# Patient Record
Sex: Female | Born: 1985 | Race: White | Hispanic: No | Marital: Single | Smoking: Current every day smoker
Health system: Southern US, Community
[De-identification: ages and names within clinical notes are randomized; demographics above are authoritative.]

## PROBLEM LIST (undated history)

## (undated) DIAGNOSIS — N390 Urinary tract infection, site not specified: Secondary | ICD-10-CM

## (undated) DIAGNOSIS — I2699 Other pulmonary embolism without acute cor pulmonale: Secondary | ICD-10-CM

## (undated) DIAGNOSIS — I82409 Acute embolism and thrombosis of unspecified deep veins of unspecified lower extremity: Secondary | ICD-10-CM

## (undated) DIAGNOSIS — N159 Renal tubulo-interstitial disease, unspecified: Secondary | ICD-10-CM

---

## 2013-02-25 ENCOUNTER — Emergency Department (HOSPITAL_COMMUNITY)
Admission: EM | Admit: 2013-02-25 | Discharge: 2013-02-25 | Disposition: A | Discharge status: Home / Self Care | Payer: Self-pay | Attending: Emergency Medicine | Admitting: Emergency Medicine

## 2013-02-25 ENCOUNTER — Encounter (HOSPITAL_COMMUNITY): Payer: Self-pay | Admitting: Emergency Medicine

## 2013-02-25 DIAGNOSIS — F17200 Nicotine dependence, unspecified, uncomplicated: Secondary | ICD-10-CM | POA: Insufficient documentation

## 2013-02-25 DIAGNOSIS — M549 Dorsalgia, unspecified: Secondary | ICD-10-CM

## 2013-02-25 DIAGNOSIS — N390 Urinary tract infection, site not specified: Secondary | ICD-10-CM | POA: Insufficient documentation

## 2013-02-25 DIAGNOSIS — R3915 Urgency of urination: Secondary | ICD-10-CM | POA: Insufficient documentation

## 2013-02-25 DIAGNOSIS — Z3202 Encounter for pregnancy test, result negative: Secondary | ICD-10-CM | POA: Insufficient documentation

## 2013-02-25 DIAGNOSIS — Z880 Allergy status to penicillin: Secondary | ICD-10-CM | POA: Insufficient documentation

## 2013-02-25 DIAGNOSIS — R300 Dysuria: Secondary | ICD-10-CM | POA: Insufficient documentation

## 2013-02-25 DIAGNOSIS — M545 Low back pain, unspecified: Secondary | ICD-10-CM | POA: Insufficient documentation

## 2013-02-25 DIAGNOSIS — R350 Frequency of micturition: Secondary | ICD-10-CM | POA: Insufficient documentation

## 2013-02-25 LAB — URINE MICROSCOPIC-ADD ON

## 2013-02-25 LAB — URINALYSIS, ROUTINE W REFLEX MICROSCOPIC
Bilirubin Urine: NEGATIVE
Glucose, UA: NEGATIVE mg/dL
Protein, ur: NEGATIVE mg/dL

## 2013-02-25 LAB — POCT PREGNANCY, URINE: Preg Test, Ur: NEGATIVE

## 2013-02-25 MED ORDER — CIPROFLOXACIN HCL 500 MG PO TABS: 500.0000 mg | ORAL_TABLET | Freq: Two times a day (BID) | ORAL | Status: DC

## 2013-02-25 MED ORDER — OXYCODONE-ACETAMINOPHEN 5-325 MG PO TABS: 1.0000 | ORAL_TABLET | ORAL | Status: DC | PRN

## 2013-02-25 MED ORDER — HYDROMORPHONE HCL PF 1 MG/ML IJ SOLN
1.0000 mg | Freq: Once | INTRAMUSCULAR | Status: AC
  Administered 2013-02-25: 1 mg via INTRAMUSCULAR
  Filled 2013-02-25: qty 1

## 2013-02-25 NOTE — ED Notes (Signed)
PT STATES SHE HAS "ALWAYS HAD IRREGULAR PERIODS".

## 2013-02-25 NOTE — ED Provider Notes (Signed)
Medical screening examination/treatment/procedure(s) were performed by non-physician practitioner and as supervising physician I was immediately available for consultation/collaboration.    Enid Skeens, MD 02/25/13 [phone]

## 2013-02-25 NOTE — ED Provider Notes (Signed)
CSN: [id number]     Arrival date & time 02/25/13  1314 History     First MD Initiated Contact with Patient 02/25/13 1331     Chief Complaint  Patient presents with  . Back Pain   (Consider location/radiation/quality/duration/timing/severity/associated sxs/prior Treatment) The history is provided by the patient.   Patient presents to the ED for low back pain. No recent injury, trauma, or fall. Patient states pain initially started on her lateral right hip but is slowly progressed to her right low back. Pain is radiating down her right buttock into her posterior thigh, but does not descend past the knee.  Denies any numbness or paresthesias of lower extremities. No loss of bowel or bladder function.  No prior back injury.  Pt states she has urinary sx at baseline-- dysuria, frequency, urgency.  Also has irregular menstrual cycles, unknown pregnancy status.  No vaginal discharge, fevers, sweats, or chills.  History reviewed. No pertinent past medical history. History reviewed. No pertinent past surgical history. No family history on file. History  Substance Use Topics  . Smoking status: Current Every Day Smoker  . Smokeless tobacco: Not on file  . Alcohol Use: Yes   OB History   Grav Para Term Preterm Abortions TAB SAB Ect Mult Living                 Review of Systems  Genitourinary: Positive for dysuria, urgency and frequency.  Musculoskeletal: Positive for back pain.  All other systems reviewed and are negative.    Allergies  Penicillins  Home Medications  No current outpatient prescriptions on file. BP 114/80  Pulse 81  Temp(Src) 98.1 F (36.7 C) (Oral)  Resp 18  SpO2 98%  LMP 01/03/2013  Physical Exam  Nursing note and vitals reviewed. Constitutional: She is oriented to person, place, and time. She appears well-developed and well-nourished. No distress.  HENT:  Head: Normocephalic and atraumatic.  Eyes: Conjunctivae and EOM are normal. Pupils are equal, round,  and reactive to light.  Neck: Normal range of motion. Neck supple.  Cardiovascular: Normal rate, regular rhythm and normal heart sounds.   Pulmonary/Chest: Effort normal and breath sounds normal. No respiratory distress.  Abdominal: There is no CVA tenderness.  Musculoskeletal: Normal range of motion. She exhibits no edema.       Arms: Pain over right SI joint, + straight leg raise at 45 degrees, strong distal pulses, sensation intact, ambulating normally  Neurological: She is alert and oriented to person, place, and time.  Skin: Skin is warm and dry. She is not diaphoretic.  Psychiatric: She has a normal mood and affect.    ED Course   Procedures (including critical care time)  Labs Reviewed  URINALYSIS, ROUTINE W REFLEX MICROSCOPIC - Abnormal; Notable for the following:    APPearance HAZY (*)    Hgb urine dipstick SMALL (*)    Nitrite POSITIVE (*)    Leukocytes, UA SMALL (*)    All other components within normal limits  URINE MICROSCOPIC-ADD ON - Abnormal; Notable for the following:    Squamous Epithelial / LPF FEW (*)    Bacteria, UA MANY (*)    All other components within normal limits  URINE CULTURE  POCT PREGNANCY, URINE   No results found. 1. Back pain   2. UTI (lower urinary tract infection)     MDM   u-preg negative. UA nitrite +, culture pending.  Back pain atraumatic, suspicious for sciatic nerve pain-- resolved with IM dilaudid.  Rx cipro  and percocet.  FU with [facility] if sx not improving in the next few days.  Discussed plan with pt, she agreed.  Return precautions advised.  Garlon Hatchet, PA-C 02/25/13 1557

## 2013-02-25 NOTE — ED Notes (Signed)
Pt c/o lower back pain, radiating to right hip and down right leg. Onset 1 week ago. States "it started with a lump on my right hip..then the lump moved around to my back". Very tender on palpation.

## 2013-02-27 LAB — URINE CULTURE

## 2013-02-28 ENCOUNTER — Telehealth (HOSPITAL_COMMUNITY): Payer: Self-pay | Admitting: Emergency Medicine

## 2013-02-28 NOTE — ED Notes (Signed)
Post ED Visit - Positive Culture Follow-up  Culture report reviewed by antimicrobial stewardship pharmacist: []  Wes Dulaney, Pharm.D., BCPS []  Celedonio Miyamoto, Pharm.D., BCPS []  Georgina Pillion, Pharm.D., BCPS []  Desha, [address].D., BCPS, AAHIVP []  Estella Husk, Pharm.D., BCPS, AAHIVP [x]  Okey Regal, Pharm.D.  Positive utinr culture Treated with Cipro, organism sensitive to the same and no further patient follow-up is required at this time.  Kylie A Holland 02/28/2013, 1:10 PM

## 2013-04-11 ENCOUNTER — Emergency Department (HOSPITAL_BASED_OUTPATIENT_CLINIC_OR_DEPARTMENT_OTHER): Payer: Self-pay

## 2013-04-11 ENCOUNTER — Emergency Department (HOSPITAL_BASED_OUTPATIENT_CLINIC_OR_DEPARTMENT_OTHER)
Admission: EM | Admit: 2013-04-11 | Discharge: 2013-04-11 | Disposition: A | Discharge status: Home / Self Care | Payer: Self-pay | Attending: Emergency Medicine | Admitting: Emergency Medicine

## 2013-04-11 ENCOUNTER — Encounter (HOSPITAL_BASED_OUTPATIENT_CLINIC_OR_DEPARTMENT_OTHER): Payer: Self-pay | Admitting: *Deleted

## 2013-04-11 DIAGNOSIS — Y9389 Activity, other specified: Secondary | ICD-10-CM | POA: Insufficient documentation

## 2013-04-11 DIAGNOSIS — Z3202 Encounter for pregnancy test, result negative: Secondary | ICD-10-CM | POA: Insufficient documentation

## 2013-04-11 DIAGNOSIS — S0003XA Contusion of scalp, initial encounter: Secondary | ICD-10-CM | POA: Insufficient documentation

## 2013-04-11 DIAGNOSIS — Z79899 Other long term (current) drug therapy: Secondary | ICD-10-CM | POA: Insufficient documentation

## 2013-04-11 DIAGNOSIS — Y92410 Unspecified street and highway as the place of occurrence of the external cause: Secondary | ICD-10-CM | POA: Insufficient documentation

## 2013-04-11 DIAGNOSIS — F17200 Nicotine dependence, unspecified, uncomplicated: Secondary | ICD-10-CM | POA: Insufficient documentation

## 2013-04-11 DIAGNOSIS — Z880 Allergy status to penicillin: Secondary | ICD-10-CM | POA: Insufficient documentation

## 2013-04-11 DIAGNOSIS — Z7982 Long term (current) use of aspirin: Secondary | ICD-10-CM | POA: Insufficient documentation

## 2013-04-11 MED ORDER — DIAZEPAM 5 MG PO TABS: 5.0000 mg | ORAL_TABLET | Freq: Two times a day (BID) | ORAL | Status: AC

## 2013-04-11 MED ORDER — OXYCODONE-ACETAMINOPHEN 5-325 MG PO TABS: 1.0000 | ORAL_TABLET | ORAL | Status: DC | PRN

## 2013-04-11 MED ORDER — LORAZEPAM 2 MG/ML IJ SOLN
1.0000 mg | Freq: Once | INTRAMUSCULAR | Status: AC
Start: 2013-04-11 — End: 2013-04-11
  Administered 2013-04-11: 1 mg via INTRAMUSCULAR
  Filled 2013-04-11: qty 1

## 2013-04-11 MED ORDER — IBUPROFEN 600 MG PO TABS: 600.0000 mg | ORAL_TABLET | Freq: Three times a day (TID) | ORAL | Status: AC
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
## 2013-04-25 ENCOUNTER — Encounter (HOSPITAL_COMMUNITY): Payer: Self-pay

## 2013-04-25 ENCOUNTER — Emergency Department (HOSPITAL_COMMUNITY): Payer: No Typology Code available for payment source

## 2013-04-25 ENCOUNTER — Emergency Department (HOSPITAL_COMMUNITY)
Admission: EM | Admit: 2013-04-25 | Discharge: 2013-04-25 | Disposition: A | Discharge status: Home / Self Care | Payer: No Typology Code available for payment source | Attending: Emergency Medicine | Admitting: Emergency Medicine

## 2013-04-25 DIAGNOSIS — Z880 Allergy status to penicillin: Secondary | ICD-10-CM | POA: Insufficient documentation

## 2013-04-25 DIAGNOSIS — N390 Urinary tract infection, site not specified: Secondary | ICD-10-CM | POA: Insufficient documentation

## 2013-04-25 DIAGNOSIS — S29011S Strain of muscle and tendon of front wall of thorax, sequela: Secondary | ICD-10-CM

## 2013-04-25 DIAGNOSIS — F17200 Nicotine dependence, unspecified, uncomplicated: Secondary | ICD-10-CM | POA: Insufficient documentation

## 2013-04-25 DIAGNOSIS — R071 Chest pain on breathing: Secondary | ICD-10-CM | POA: Insufficient documentation

## 2013-04-25 DIAGNOSIS — G8911 Acute pain due to trauma: Secondary | ICD-10-CM | POA: Insufficient documentation

## 2013-04-25 HISTORY — DX: Renal tubulo-interstitial disease, unspecified: N15.9

## 2013-04-25 HISTORY — DX: Urinary tract infection, site not specified: N39.0

## 2013-04-25 LAB — CBC WITH DIFFERENTIAL/PLATELET
Lymphocytes Relative: 28 % (ref 12–46)
Lymphs Abs: 2.8 10*3/uL (ref 0.7–4.0)
Neutro Abs: 6.6 10*3/uL (ref 1.7–7.7)
Neutrophils Relative %: 66 % (ref 43–77)
Platelets: 302 10*3/uL (ref 150–400)
RBC: 4.73 MIL/uL (ref 3.87–5.11)
WBC: 10 10*3/uL (ref 4.0–10.5)

## 2013-04-25 LAB — URINALYSIS, ROUTINE W REFLEX MICROSCOPIC
Glucose, UA: NEGATIVE mg/dL
Specific Gravity, Urine: 1.036 — ABNORMAL HIGH (ref 1.005–1.030)
Urobilinogen, UA: 1 mg/dL (ref 0.0–1.0)

## 2013-04-25 LAB — BASIC METABOLIC PANEL
CO2: 20 mEq/L (ref 19–32)
Chloride: 107 mEq/L (ref 96–112)
GFR calc non Af Amer: >90 mL/min (ref >90)
Glucose, Bld: 96 mg/dL (ref 70–99)
Potassium: 3.6 mEq/L (ref 3.5–5.1)
Sodium: 139 mEq/L (ref 135–145)

## 2013-04-25 LAB — URINE MICROSCOPIC-ADD ON

## 2013-04-25 LAB — POCT PREGNANCY, URINE: Preg Test, Ur: NEGATIVE

## 2013-04-25 MED ORDER — MORPHINE SULFATE 4 MG/ML IJ SOLN
4.0000 mg | Freq: Once | INTRAMUSCULAR | Status: AC
  Administered 2013-04-25: 4 mg via INTRAVENOUS
  Filled 2013-04-25: qty 1

## 2013-04-25 MED ORDER — SODIUM CHLORIDE 0.9 % IV SOLN
1000.0000 mL | Freq: Once | INTRAVENOUS | Status: AC
Start: 2013-04-25 — End: 2013-04-25
  Administered 2013-04-25: 1000 mL via INTRAVENOUS

## 2013-04-25 MED ORDER — IOHEXOL 350 MG/ML SOLN
100.0000 mL | Freq: Once | INTRAVENOUS | Status: AC | PRN
  Administered 2013-04-25: 100 mL via INTRAVENOUS

## 2013-04-25 MED ORDER — IBUPROFEN 800 MG PO TABS: 800.0000 mg | ORAL_TABLET | Freq: Three times a day (TID) | ORAL | Status: DC

## 2013-04-25 MED ORDER — HYDROCODONE-ACETAMINOPHEN 5-325 MG PO TABS: 2.0000 | ORAL_TABLET | Freq: Four times a day (QID) | ORAL | Status: DC | PRN

## 2013-04-25 MED ORDER — SODIUM CHLORIDE 0.9 % IV SOLN
1000.0000 mL | Freq: Once | INTRAVENOUS | Status: AC
  Administered 2013-04-25: 1000 mL via INTRAVENOUS

## 2013-04-25 MED ORDER — SULFAMETHOXAZOLE-TRIMETHOPRIM 800-160 MG PO TABS: 1.0000 | ORAL_TABLET | Freq: Two times a day (BID) | ORAL | Status: DC

## 2013-04-25 MED ORDER — SODIUM CHLORIDE 0.9 % IV SOLN
1000.0000 mL | INTRAVENOUS | Status: DC
  Administered 2013-04-25: 1000 mL via INTRAVENOUS

## 2013-04-25 MED ORDER — METHOCARBAMOL 500 MG PO TABS: 500.0000 mg | ORAL_TABLET | Freq: Two times a day (BID) | ORAL | Status: DC

## 2013-04-25 NOTE — ED Notes (Signed)
CT notified that pt's pregnancy was negative; so pt able to go to scan.

## 2013-04-25 NOTE — ED Provider Notes (Signed)
  Medical screening examination/treatment/procedure(s) were performed by non-physician practitioner and as supervising physician I was immediately available for consultation/collaboration.    Gerhard Munch, MD 04/25/13 2324
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
## 2014-11-25 ENCOUNTER — Encounter (HOSPITAL_COMMUNITY): Payer: Self-pay

## 2014-11-25 ENCOUNTER — Emergency Department (HOSPITAL_COMMUNITY)
Admission: EM | Admit: 2014-11-25 | Discharge: 2014-11-25 | Disposition: A | Discharge status: Home / Self Care | Payer: Self-pay | Attending: Emergency Medicine | Admitting: Emergency Medicine

## 2014-11-25 DIAGNOSIS — Z87448 Personal history of other diseases of urinary system: Secondary | ICD-10-CM | POA: Insufficient documentation

## 2014-11-25 DIAGNOSIS — Z79899 Other long term (current) drug therapy: Secondary | ICD-10-CM | POA: Insufficient documentation

## 2014-11-25 DIAGNOSIS — Z791 Long term (current) use of non-steroidal anti-inflammatories (NSAID): Secondary | ICD-10-CM | POA: Insufficient documentation

## 2014-11-25 DIAGNOSIS — Z880 Allergy status to penicillin: Secondary | ICD-10-CM | POA: Insufficient documentation

## 2014-11-25 DIAGNOSIS — G5600 Carpal tunnel syndrome, unspecified upper limb: Secondary | ICD-10-CM | POA: Insufficient documentation

## 2014-11-25 DIAGNOSIS — Z720 Tobacco use: Secondary | ICD-10-CM | POA: Insufficient documentation

## 2014-11-25 DIAGNOSIS — Z87440 Personal history of urinary (tract) infections: Secondary | ICD-10-CM | POA: Insufficient documentation

## 2014-11-25 MED ORDER — KETOROLAC TROMETHAMINE 60 MG/2ML IM SOLN
60.0000 mg | Freq: Once | INTRAMUSCULAR | Status: AC
  Administered 2014-11-25: 60 mg via INTRAMUSCULAR
  Filled 2014-11-25: qty 2

## 2014-11-25 MED ORDER — DEXAMETHASONE SODIUM PHOSPHATE 4 MG/ML IJ SOLN
8.0000 mg | Freq: Once | INTRAMUSCULAR | Status: AC
  Administered 2014-11-25: 8 mg via INTRAMUSCULAR
  Filled 2014-11-25: qty 2

## 2014-11-25 MED ORDER — IBUPROFEN 800 MG PO TABS: 800.0000 mg | ORAL_TABLET | Freq: Three times a day (TID) | ORAL | Status: DC

## 2014-11-25 MED ORDER — DEXAMETHASONE 4 MG PO TABS: 4.0000 mg | ORAL_TABLET | Freq: Two times a day (BID) | ORAL | Status: DC

## 2014-11-25 NOTE — Discharge Instructions (Signed)
Please see Dr. Romeo AppleHarrison as soon as possible concerning your upper extremity pains. Please use your wrist splint until seen by orthopedics. Carpal Tunnel Syndrome The carpal tunnel is a narrow area located on the palm side of your wrist. The tunnel is formed by the wrist bones and ligaments. Nerves, blood vessels, and tendons pass through the carpal tunnel. Repeated wrist motion or certain diseases may cause swelling within the tunnel. This swelling pinches the main nerve in the wrist (median nerve) and causes the painful hand and arm condition called carpal tunnel syndrome. CAUSES   Repeated wrist motions.  Wrist injuries.  Certain diseases like arthritis, diabetes, alcoholism, hyperthyroidism, and kidney failure.  Obesity.  Pregnancy. SYMPTOMS   A "pins and needles" feeling in your fingers or hand, especially in your thumb, index and middle fingers.  Tingling or numbness in your fingers or hand.  An aching feeling in your entire arm, especially when your wrist and elbow are bent for long periods of time.  Wrist pain that goes up your arm to your shoulder.  Pain that goes down into your palm or fingers.  A weak feeling in your hands. DIAGNOSIS  Your health care provider will take your history and perform a physical exam. An electromyography test may be needed. This test measures electrical signals sent out by your nerves into the muscles. The electrical signals are usually slowed by carpal tunnel syndrome. You may also need X-rays. TREATMENT  Carpal tunnel syndrome may clear up by itself. Your health care provider may recommend a wrist splint or medicine such as a nonsteroidal anti-inflammatory medicine. Cortisone injections may help. Sometimes, surgery may be needed to free the pinched nerve.  HOME CARE INSTRUCTIONS   Take all medicine as directed by your health care provider. Only take over-the-counter or prescription medicines for pain, discomfort, or fever as directed by your  health care provider.  If you were given a splint to keep your wrist from bending, wear it as directed. It is important to wear the splint at night. Wear the splint for as long as you have pain or numbness in your hand, arm, or wrist. This may take 1 to 2 months.  Rest your wrist from any activity that may be causing your pain. If your symptoms are work-related, you may need to talk to your employer about changing to a job that does not require using your wrist.  Put ice on your wrist after long periods of wrist activity.  Put ice in a plastic bag.  Place a towel between your skin and the bag.  Leave the ice on for 15-20 minutes, 03-04 times a day.  Keep all follow-up visits as directed by your health care provider. This includes any orthopedic referrals, physical therapy, and rehabilitation. Any delay in getting necessary care could result in a delay or failure of your condition to heal. SEEK IMMEDIATE MEDICAL CARE IF:   You have new, unexplained symptoms.  Your symptoms get worse and are not helped or controlled with medicines. MAKE SURE YOU:   Understand these instructions.  Will watch your condition.  Will get help right away if you are not doing well or get worse. Document Released: 07/13/2000 Document Revised: 11/30/2013 Document Reviewed: 06/01/2011 [facility] Patient Information 2015 [employer] [place]. This information is not intended to replace advice given to you by your health care provider. Make sure you discuss any questions you have with your health care provider.

## 2014-11-25 NOTE — ED Notes (Signed)
Pt states she has been having pain in both hands up to her elbows for about 4 months. States it has gotten worse the past two weeks

## 2014-11-25 NOTE — ED Provider Notes (Signed)
CSN: [id number]     Arrival date & time 11/25/14  4098 History   First MD Initiated Contact with Patient 11/25/14 [phone]     Chief Complaint  Patient presents with  . Arm Pain     (Consider location/radiation/quality/duration/timing/severity/associated sxs/prior Treatment) Patient is a 29 y.o. female presenting with arm pain. The history is provided by the patient.  Arm Pain This is a chronic problem. The current episode started more than 1 month ago. The problem occurs intermittently. The problem has been gradually worsening. Associated symptoms include arthralgias and numbness. Pertinent negatives include no fever. Nothing aggravates the symptoms. Treatments tried: goody powders. The treatment provided mild relief.    Past Medical History  Diagnosis Date  . UTI (lower urinary tract infection)   . Kidney infection    History reviewed. No pertinent past surgical history. No family history on file. History  Substance Use Topics  . Smoking status: Current Every Day Smoker -- 1.00 packs/day    Types: Cigarettes  . Smokeless tobacco: Not on file  . Alcohol Use: Yes     Comment: occasionally   OB History    No data available     Review of Systems  Constitutional: Negative for fever.  Musculoskeletal: Positive for arthralgias.  Neurological: Positive for numbness.  All other systems reviewed and are negative.     Allergies  Penicillins  Home Medications   Prior to Admission medications   Medication Sig Start Date End Date Taking? Authorizing Provider  Aspirin-Salicylamide-Caffeine (BC HEADACHE POWDER PO) Take 1 packet by mouth as needed (headache).     Historical Provider, MD  HYDROcodone-acetaminophen (NORCO/VICODIN) 5-325 MG per tablet Take 2 tablets by mouth every 6 (six) hours as needed for pain. 04/25/13   Roxy Horseman, PA-C  ibuprofen (ADVIL,MOTRIN) 800 MG tablet Take 1 tablet (800 mg total) by mouth 3 (three) times daily. 04/25/13   Roxy Horseman, PA-C   methocarbamol (ROBAXIN) 500 MG tablet Take 1 tablet (500 mg total) by mouth 2 (two) times daily. 04/25/13   Roxy Horseman, PA-C  sulfamethoxazole-trimethoprim (SEPTRA DS) 800-160 MG per tablet Take 1 tablet by mouth every 12 (twelve) hours. 04/25/13   Roxy Horseman, PA-C   BP 126/86 mmHg  Pulse 86  Temp(Src) 98.7 F (37.1 C) (Oral)  Resp 16  Ht  (1.727 m)  Wt 210 lb (95.255 kg)  BMI 31.94 kg/m2  SpO2 99%  LMP 09/26/2014 (Approximate) Physical Exam  Constitutional: She is oriented to person, place, and time. She appears well-developed and well-nourished.  Non-toxic appearance.  HENT:  Head: Normocephalic.  Right Ear: Tympanic membrane and external ear normal.  Left Ear: Tympanic membrane and external ear normal.  Eyes: EOM and lids are normal. Pupils are equal, round, and reactive to light.  Neck: Normal range of motion. Neck supple. Carotid bruit is not present.  Cardiovascular: Normal rate, regular rhythm, normal heart sounds, intact distal pulses and normal pulses.   Pulmonary/Chest: Breath sounds normal. No respiratory distress.  Abdominal: Soft. Bowel sounds are normal. There is no tenderness. There is no guarding.  Musculoskeletal: Normal range of motion.       Right elbow: Normal.      Left elbow: Normal.       Right forearm: She exhibits tenderness. She exhibits no edema and no deformity.       Left forearm: She exhibits tenderness. She exhibits no edema and no deformity.  Pos Tinel's sign bilat.  Lymphadenopathy:       Head (right  side): No submandibular adenopathy present.       Head (left side): No submandibular adenopathy present.    She has no cervical adenopathy.  Neurological: She is alert and oriented to person, place, and time. She has normal strength. No cranial nerve deficit or sensory deficit.  Skin: Skin is warm and dry.  Psychiatric: She has a normal mood and affect. Her speech is normal.  Nursing note and vitals reviewed.   ED Course  Procedures  (including critical care time) Labs Review Labs Reviewed - No data to display  Imaging Review No results found.   EKG Interpretation None      MDM  Vital signs stable. No gross neuro deficits noted of the upper extremities. Exam favors carpal tunnel symptoms. Pt fitted with wrist splints and referred to orthopedics.   Final diagnoses:  None    **I have reviewed nursing notes, vital signs, and all appropriate lab and imaging results for this patient.Ivery Quale*    Binyamin Nelis, PA-C 11/26/14 1052  Donnetta HutchingBrian Cook, MD 11/26/14 1535

## 2019-03-02 ENCOUNTER — Emergency Department (HOSPITAL_COMMUNITY)
Admission: EM | Admit: 2019-03-02 | Discharge: 2019-03-02 | Disposition: A | Discharge status: Home / Self Care | Payer: Self-pay | Attending: Emergency Medicine | Admitting: Emergency Medicine

## 2019-03-02 ENCOUNTER — Other Ambulatory Visit: Payer: Self-pay

## 2019-03-02 ENCOUNTER — Encounter (HOSPITAL_COMMUNITY): Payer: Self-pay | Admitting: Emergency Medicine

## 2019-03-02 DIAGNOSIS — M79662 Pain in left lower leg: Secondary | ICD-10-CM | POA: Insufficient documentation

## 2019-03-02 DIAGNOSIS — Z5321 Procedure and treatment not carried out due to patient leaving prior to being seen by health care provider: Secondary | ICD-10-CM | POA: Insufficient documentation

## 2019-03-02 HISTORY — DX: Acute embolism and thrombosis of unspecified deep veins of unspecified lower extremity: I82.409

## 2019-03-02 HISTORY — DX: Other pulmonary embolism without acute cor pulmonale: I26.99

## 2019-03-02 LAB — COMPREHENSIVE METABOLIC PANEL
ALT: 34 U/L (ref 0–44)
AST: 24 U/L (ref 15–41)
Albumin: 4 g/dL (ref 3.5–5.0)
Alkaline Phosphatase: 64 U/L (ref 38–126)
Anion gap: 9 (ref 5–15)
BUN: 12 mg/dL (ref 6–20)
CO2: 22 mmol/L (ref 22–32)
Calcium: 9.2 mg/dL (ref 8.9–10.3)
Chloride: 111 mmol/L (ref 98–111)
Creatinine, Ser: 0.7 mg/dL (ref 0.44–1.00)
GFR calc Af Amer: >60 mL/min (ref >60)
GFR calc non Af Amer: >60 mL/min (ref >60)
Glucose, Bld: 98 mg/dL (ref 70–99)
Potassium: 3.8 mmol/L (ref 3.5–5.1)
Sodium: 142 mmol/L (ref 135–145)
Total Bilirubin: 0.4 mg/dL (ref 0.3–1.2)
Total Protein: 6.8 g/dL (ref 6.5–8.1)

## 2019-03-02 LAB — CBC WITH DIFFERENTIAL/PLATELET
Abs Immature Granulocytes: 0.05 10*3/uL (ref 0.00–0.07)
Basophils Absolute: 0.1 10*3/uL (ref 0.0–0.1)
Basophils Relative: 1 %
Eosinophils Absolute: 0.2 10*3/uL (ref 0.0–0.5)
Eosinophils Relative: 2 %
HCT: 42.1 % (ref 36.0–46.0)
Hemoglobin: 13.9 g/dL (ref 12.0–15.0)
Immature Granulocytes: 1 %
Lymphocytes Relative: 22 %
Lymphs Abs: 2.1 10*3/uL (ref 0.7–4.0)
MCH: 28.3 pg (ref 26.0–34.0)
MCHC: 33 g/dL (ref 30.0–36.0)
MCV: 85.7 fL (ref 80.0–100.0)
Monocytes Absolute: 0.8 10*3/uL (ref 0.1–1.0)
Monocytes Relative: 8 %
Neutro Abs: 6.4 10*3/uL (ref 1.7–7.7)
Neutrophils Relative %: 66 %
Platelets: 294 10*3/uL (ref 150–400)
RBC: 4.91 MIL/uL (ref 3.87–5.11)
RDW: 12.7 % (ref 11.5–15.5)
WBC: 9.6 10*3/uL (ref 4.0–10.5)
nRBC: 0 % (ref 0.0–0.2)

## 2019-03-02 LAB — PROTIME-INR
INR: 1 (ref 0.8–1.2)
Prothrombin Time: 12.9 seconds (ref 11.4–15.2)

## 2019-03-02 LAB — D-DIMER, QUANTITATIVE (NOT AT ARMC): D-Dimer, Quant: <0.27 ug/mL-FEU (ref 0.00–0.50)

## 2019-03-02 NOTE — ED Notes (Signed)
Pt told registration that she was going to leave due to the wait time and go to another hospital.

## 2019-03-02 NOTE — ED Triage Notes (Signed)
Pt c/o LT calf pain, swelling, and redness that is warm to touch x 8 days. Hx of DVT.

## 2019-09-19 ENCOUNTER — Emergency Department: Payer: Self-pay

## 2019-09-19 ENCOUNTER — Other Ambulatory Visit: Payer: Self-pay

## 2019-09-19 ENCOUNTER — Emergency Department
Admission: EM | Admit: 2019-09-19 | Discharge: 2019-09-19 | Disposition: A | Discharge status: Home / Self Care | Payer: Self-pay | Attending: Emergency Medicine | Admitting: Emergency Medicine

## 2019-09-19 DIAGNOSIS — F17210 Nicotine dependence, cigarettes, uncomplicated: Secondary | ICD-10-CM | POA: Insufficient documentation

## 2019-09-19 DIAGNOSIS — Z79899 Other long term (current) drug therapy: Secondary | ICD-10-CM | POA: Insufficient documentation

## 2019-09-19 DIAGNOSIS — R519 Headache, unspecified: Secondary | ICD-10-CM | POA: Insufficient documentation

## 2019-09-19 DIAGNOSIS — G510 Bell's palsy: Secondary | ICD-10-CM | POA: Insufficient documentation

## 2019-09-19 LAB — COMPREHENSIVE METABOLIC PANEL
ALT: 27 U/L (ref 0–44)
AST: 21 U/L (ref 15–41)
Albumin: 4.4 g/dL (ref 3.5–5.0)
Alkaline Phosphatase: 74 U/L (ref 38–126)
Anion gap: 9 (ref 5–15)
BUN: 10 mg/dL (ref 6–20)
CO2: 26 mmol/L (ref 22–32)
Calcium: 9.2 mg/dL (ref 8.9–10.3)
Chloride: 106 mmol/L (ref 98–111)
Creatinine, Ser: 0.79 mg/dL (ref 0.44–1.00)
GFR calc Af Amer: >60 mL/min (ref >60)
GFR calc non Af Amer: >60 mL/min (ref >60)
Glucose, Bld: 116 mg/dL — ABNORMAL HIGH (ref 70–99)
Potassium: 3.7 mmol/L (ref 3.5–5.1)
Sodium: 141 mmol/L (ref 135–145)
Total Bilirubin: 0.5 mg/dL (ref 0.3–1.2)
Total Protein: 7.3 g/dL (ref 6.5–8.1)

## 2019-09-19 LAB — DIFFERENTIAL
Abs Immature Granulocytes: 0.03 10*3/uL (ref 0.00–0.07)
Basophils Absolute: 0.1 10*3/uL (ref 0.0–0.1)
Basophils Relative: 1 %
Eosinophils Absolute: 0.2 10*3/uL (ref 0.0–0.5)
Eosinophils Relative: 2 %
Immature Granulocytes: 0 %
Lymphocytes Relative: 34 %
Lymphs Abs: 3.4 10*3/uL (ref 0.7–4.0)
Monocytes Absolute: 0.6 10*3/uL (ref 0.1–1.0)
Monocytes Relative: 6 %
Neutro Abs: 5.8 10*3/uL (ref 1.7–7.7)
Neutrophils Relative %: 57 %

## 2019-09-19 LAB — APTT: aPTT: 34 seconds (ref 24–36)

## 2019-09-19 LAB — CBC
HCT: 45.1 % (ref 36.0–46.0)
Hemoglobin: 15.1 g/dL — ABNORMAL HIGH (ref 12.0–15.0)
MCH: 29.3 pg (ref 26.0–34.0)
MCHC: 33.5 g/dL (ref 30.0–36.0)
MCV: 87.6 fL (ref 80.0–100.0)
Platelets: 323 10*3/uL (ref 150–400)
RBC: 5.15 MIL/uL — ABNORMAL HIGH (ref 3.87–5.11)
RDW: 12.7 % (ref 11.5–15.5)
WBC: 10.1 10*3/uL (ref 4.0–10.5)
nRBC: 0 % (ref 0.0–0.2)

## 2019-09-19 LAB — PROTIME-INR
INR: 1 (ref 0.8–1.2)
Prothrombin Time: 12.7 seconds (ref 11.4–15.2)

## 2019-09-19 MED ORDER — PROCHLORPERAZINE EDISYLATE 10 MG/2ML IJ SOLN
10.0000 mg | Freq: Once | INTRAMUSCULAR | Status: AC
  Administered 2019-09-19: 10 mg via INTRAVENOUS
  Filled 2019-09-19: qty 2

## 2019-09-19 MED ORDER — GADOBUTROL 1 MMOL/ML IV SOLN
10.0000 mL | Freq: Once | INTRAVENOUS | Status: AC | PRN
  Administered 2019-09-19: 10 mL via INTRAVENOUS

## 2019-09-19 MED ORDER — DIPHENHYDRAMINE HCL 50 MG/ML IJ SOLN
25.0000 mg | Freq: Once | INTRAMUSCULAR | Status: AC
  Administered 2019-09-19: 25 mg via INTRAVENOUS
  Filled 2019-09-19: qty 1

## 2019-09-19 MED ORDER — PREDNISONE 20 MG PO TABS: 60.0000 mg | ORAL_TABLET | Freq: Every day | ORAL | 0 refills | Status: AC

## 2019-09-19 MED ORDER — SODIUM CHLORIDE 0.9 % IV BOLUS
1000.0000 mL | Freq: Once | INTRAVENOUS | Status: AC
  Administered 2019-09-19: 1000 mL via INTRAVENOUS

## 2019-09-19 NOTE — ED Provider Notes (Signed)
[facility] Emergency Department Provider Note   ____________________________________________   First MD Initiated Contact with Patient 09/19/19 1703     (approximate)  I have reviewed the triage vital signs and the nursing notes.   HISTORY  Chief Complaint Facial Pain and Numbness    HPI Diane Kim is a 34 y.o. female with past medical history of DVT who presents to the ED complaining of headache and facial numbness.  Patient reports she had sudden onset of numbness affecting the entire right side of her face between 8 and 830 this morning.  It was associated with a headache around her right temple and when she went to drink her coffee, she noticed that it seemed to leak out the right side of her mouth.  She has felt like she cannot move the right side of her face since the onset of symptoms.  She reports a history of headaches as a child, but never had any numbness or weakness with her headaches and was never diagnosed with migraines.  She does endorse a history of DVT in her leg, as well as [place] diagnosed with atrial fibrillation and worked up for a stroke.  She states they were not able to find a stroke at that time, but she was started on blood thinners.  She has not been taking her blood thinners for least the past 6 months as she lost her insurance with the onset of Covid.  She denies any symptoms affecting her right arm and right leg.        Past Medical History:  Diagnosis Date  . DVT (deep venous thrombosis) (HCC)   . Kidney infection   . Pulmonary embolism (HCC)   . UTI (lower urinary tract infection)     There are no problems to display for this patient.   History reviewed. No pertinent surgical history.  Prior to Admission medications   Medication Sig Start Date End Date Taking? Authorizing Provider  Aspirin-Salicylamide-Caffeine (BC HEADACHE POWDER PO) Take 1 packet by mouth as  needed (headache).     [provider]  dexamethasone (DECADRON) 4 MG tablet Take 1 tablet (4 mg total) by mouth 2 (two) times daily with a meal. 11/25/14   Ivery Quale, PA-C  HYDROcodone-acetaminophen (NORCO/VICODIN) 5-325 MG per tablet Take 2 tablets by mouth every 6 (six) hours as needed for pain. Patient not taking: Reported on 11/25/2014 04/25/13   Roxy Horseman, PA-C  ibuprofen (ADVIL,MOTRIN) 800 MG tablet Take 1 tablet (800 mg total) by mouth 3 (three) times daily. 11/25/14   Ivery Quale, PA-C  lamoTRIgine (LAMICTAL) 100 MG tablet Take 100 mg by mouth daily.    [provider]  methocarbamol (ROBAXIN) 500 MG tablet Take 1 tablet (500 mg total) by mouth 2 (two) times daily. Patient not taking: Reported on 11/25/2014 04/25/13   Roxy Horseman, PA-C  predniSONE (DELTASONE) 20 MG tablet Take 3 tablets (60 mg total) by mouth daily for 7 days. 09/19/19 09/26/19  Chesley Noon, MD  sulfamethoxazole-trimethoprim (SEPTRA DS) 800-160 MG per tablet Take 1 tablet by mouth every 12 (twelve) hours. Patient not taking: Reported on 11/25/2014 04/25/13   Roxy Horseman, PA-C    Allergies Penicillins  History reviewed. No pertinent family history.  Social History Social History   Tobacco Use  . Smoking status: Current Every Day Smoker    Packs/day: 0.50    Types: Cigarettes  . Smokeless tobacco: Never Used  .  Tobacco comment: 8-10/day  Substance Use Topics  . Alcohol use: Yes    Comment: occasionally  . Drug use: Yes    Types: Marijuana    Comment: last [place] this morning    Review of Systems  Constitutional: No fever/chills Eyes: No visual changes. ENT: No sore throat. Cardiovascular: Denies chest pain. Respiratory: Denies shortness of breath. Gastrointestinal: No abdominal pain.  No nausea, no vomiting.  No diarrhea.  No constipation. Genitourinary: Negative for dysuria. Musculoskeletal: Negative for back pain. Skin: Negative for rash. Neurological: Positive  for headache, facial numbness, and facial droop.  Negative for extremity numbness or weakness.  ____________________________________________   PHYSICAL EXAM:  VITAL SIGNS: ED Triage Vitals  Enc Vitals Group     BP 09/19/19 1556 126/82     Pulse Rate 09/19/19 1556 85     Resp 09/19/19 1556 18     Temp 09/19/19 1556 99 F (37.2 C)     Temp Source 09/19/19 1556 Oral     SpO2 09/19/19 1556 97 %     Weight 09/19/19 1550 238 lb (108 kg)     Height 09/19/19 1550 5\' 8"  (1.727 m)     Head Circumference --      Peak Flow --      Pain Score 09/19/19 1550 5     Pain Loc --      Pain Edu? --      Excl. in [place]? --     Constitutional: Alert and oriented. Eyes: Conjunctivae are normal. Head: Atraumatic. Nose: No congestion/rhinnorhea. Mouth/Throat: Mucous membranes are moist. Neck: Normal ROM Cardiovascular: Normal rate, regular rhythm. Grossly normal heart sounds. Respiratory: Normal respiratory effort.  No retractions. Lungs CTAB. Gastrointestinal: Soft and nontender. No distention. Genitourinary: deferred Musculoskeletal: No lower extremity tenderness nor edema. Neurologic:  Normal speech and language.  Right-sided facial droop noted with flattening of right nasolabial fold, diminished raising of right eyebrow.  Subjectively decreased sensation to entire right side of face.  5 out of 5 strength in bilateral upper and lower extremities, no pronator drift. Skin:  Skin is warm, dry and intact. No rash noted. Psychiatric: Mood and affect are normal. Speech and behavior are normal.  ____________________________________________   LABS (all labs ordered are listed, but only abnormal results are displayed)  Labs Reviewed  CBC - Abnormal; Notable for the following components:      Result Value   RBC 5.15 (*)    Hemoglobin 15.1 (*)    All other components within normal limits  COMPREHENSIVE METABOLIC PANEL - Abnormal; Notable for the following components:   Glucose, Bld 116 (*)    All  other components within normal limits  PROTIME-INR  APTT  DIFFERENTIAL  CBG MONITORING, ED  POC URINE PREG, ED   ____________________________________________  EKG  ED ECG REPORT I, Blake Divine, the attending physician, personally viewed and interpreted this ECG.   Date: 09/19/2019  EKG Time: 15:57  Rate: 81  Rhythm: normal sinus rhythm  Axis: Normal  Intervals:none  ST&T Change: None   PROCEDURES  Procedure(s) performed (including Critical Care):  Procedures   ____________________________________________   INITIAL IMPRESSION / ASSESSMENT AND PLAN / ED COURSE       34 year old female presents to the ED with acute onset of right-sided facial numbness and weakness between 8 and 830 this morning.  Symptoms have persisted throughout the day, although she denies any symptoms affecting her extremities.  She does appear to have a facial droop on the right as well as  decreased raise of her right eyebrow.  Differential includes Bell's palsy and complex migraine, however her unclear history of DVT and atrial fibrillation would raise her risk for stroke as well as venous sinus thrombosis.  We will treat for migraine and further assess with MRI and MRV of brain.  CT head is negative for acute process and lab work thus far is unremarkable.  Patient's headache improved, MRI negative for stroke and MRV negative for venous sinus thrombosis.  MRI does note enhancement at right 7th cranial nerve consistent with Bell's palsy.  This is consistent with patient's neurologic exam given involvement of her forehead.  We will start on a course of steroids and patient [facility]ed [facility], otherwise return to the ED for new or worsening symptoms.  Patient agrees with plan.      ____________________________________________   FINAL CLINICAL IMPRESSION(S) / ED DIAGNOSES  Final diagnoses:  Bell's palsy     ED Discharge Orders         Ordered    predniSONE  (DELTASONE) 20 MG tablet  Daily     09/19/19 2126           Note:  This document was prepared using Dragon voice recognition software and may include unintentional dictation errors.   Chesley Noon, MD 09/19/19 2236

## 2019-09-19 NOTE — ED Notes (Signed)
Patient is waiting in room for transport. Will discharge when ride is here.

## 2019-09-19 NOTE — ED Notes (Signed)
Patient is able to close right eye, but not as tightly as left eye. Smile is asymmetrical, but is both sides of mouth are mobile. Patient is alert and oriented x4.

## 2019-09-19 NOTE — ED Triage Notes (Signed)
Pt states numbness to R side of face noted between 8 and 8:30 this AM. Was drinking coffee about 9:30 and noted R side of face wouldn't move. Pt tearful in triage.   A&O, ambulatory. No arm/leg drift. States sensation "lighter" on R side of face and R arm. Able to close eyes but had a hard time making a smile. Voice clear, no vision changes.

## 2019-10-04 ENCOUNTER — Emergency Department: Payer: Self-pay

## 2019-10-04 ENCOUNTER — Emergency Department
Admission: EM | Admit: 2019-10-04 | Discharge: 2019-10-04 | Disposition: A | Discharge status: Home / Self Care | Payer: Self-pay | Attending: Emergency Medicine | Admitting: Emergency Medicine

## 2019-10-04 ENCOUNTER — Encounter: Payer: Self-pay | Admitting: Emergency Medicine

## 2019-10-04 ENCOUNTER — Other Ambulatory Visit: Payer: Self-pay

## 2019-10-04 DIAGNOSIS — F17210 Nicotine dependence, cigarettes, uncomplicated: Secondary | ICD-10-CM | POA: Insufficient documentation

## 2019-10-04 DIAGNOSIS — R202 Paresthesia of skin: Secondary | ICD-10-CM

## 2019-10-04 DIAGNOSIS — Z79899 Other long term (current) drug therapy: Secondary | ICD-10-CM | POA: Insufficient documentation

## 2019-10-04 DIAGNOSIS — R791 Abnormal coagulation profile: Secondary | ICD-10-CM | POA: Insufficient documentation

## 2019-10-04 DIAGNOSIS — G510 Bell's palsy: Secondary | ICD-10-CM | POA: Insufficient documentation

## 2019-10-04 LAB — GLUCOSE, CAPILLARY: Glucose-Capillary: 96 mg/dL (ref 70–99)

## 2019-10-04 LAB — DIFFERENTIAL
Abs Immature Granulocytes: 0.06 10*3/uL (ref 0.00–0.07)
Basophils Absolute: 0 10*3/uL (ref 0.0–0.1)
Basophils Relative: 0 %
Eosinophils Absolute: 0.1 10*3/uL (ref 0.0–0.5)
Eosinophils Relative: 1 %
Immature Granulocytes: 1 %
Lymphocytes Relative: 23 %
Lymphs Abs: 2.3 10*3/uL (ref 0.7–4.0)
Monocytes Absolute: 0.8 10*3/uL (ref 0.1–1.0)
Monocytes Relative: 7 %
Neutro Abs: 7 10*3/uL (ref 1.7–7.7)
Neutrophils Relative %: 68 %

## 2019-10-04 LAB — COMPREHENSIVE METABOLIC PANEL
ALT: 37 U/L (ref 0–44)
AST: 19 U/L (ref 15–41)
Albumin: 4.1 g/dL (ref 3.5–5.0)
Alkaline Phosphatase: 69 U/L (ref 38–126)
Anion gap: 7 (ref 5–15)
BUN: 14 mg/dL (ref 6–20)
CO2: 25 mmol/L (ref 22–32)
Calcium: 8.8 mg/dL — ABNORMAL LOW (ref 8.9–10.3)
Chloride: 107 mmol/L (ref 98–111)
Creatinine, Ser: 0.73 mg/dL (ref 0.44–1.00)
GFR calc Af Amer: >60 mL/min (ref >60)
GFR calc non Af Amer: >60 mL/min (ref >60)
Glucose, Bld: 95 mg/dL (ref 70–99)
Potassium: 3.5 mmol/L (ref 3.5–5.1)
Sodium: 139 mmol/L (ref 135–145)
Total Bilirubin: 0.5 mg/dL (ref 0.3–1.2)
Total Protein: 7.1 g/dL (ref 6.5–8.1)

## 2019-10-04 LAB — CBC
HCT: 43.9 % (ref 36.0–46.0)
Hemoglobin: 14.9 g/dL (ref 12.0–15.0)
MCH: 29.6 pg (ref 26.0–34.0)
MCHC: 33.9 g/dL (ref 30.0–36.0)
MCV: 87.1 fL (ref 80.0–100.0)
Platelets: 291 10*3/uL (ref 150–400)
RBC: 5.04 MIL/uL (ref 3.87–5.11)
RDW: 13.2 % (ref 11.5–15.5)
WBC: 10.2 10*3/uL (ref 4.0–10.5)
nRBC: 0 % (ref 0.0–0.2)

## 2019-10-04 LAB — PROTIME-INR
INR: 0.9 (ref 0.8–1.2)
Prothrombin Time: 12.3 seconds (ref 11.4–15.2)

## 2019-10-04 LAB — APTT: aPTT: 29 seconds (ref 24–36)

## 2019-10-04 MED ORDER — PROCHLORPERAZINE EDISYLATE 10 MG/2ML IJ SOLN
10.0000 mg | Freq: Once | INTRAMUSCULAR | Status: DC
  Filled 2019-10-04: qty 2

## 2019-10-04 MED ORDER — SODIUM CHLORIDE 0.9 % IV BOLUS: 1000.0000 mL | Freq: Once | INTRAVENOUS | Status: DC

## 2019-10-04 MED ORDER — BUTALBITAL-APAP-CAFFEINE 50-325-40 MG PO TABS
1.0000 | ORAL_TABLET | Freq: Once | ORAL | Status: AC
  Administered 2019-10-04: 20:00:00 1 via ORAL
  Filled 2019-10-04: qty 1

## 2019-10-04 NOTE — ED Provider Notes (Signed)
[facility] Emergency Department Provider Note  ____________________________________________   I have reviewed the triage vital signs and the nursing notes.   HISTORY  Chief Complaint Numbness   History limited by: Not Limited   HPI Diane Kim is a 34 y.o. female who presents to the emergency department today because of concerns for continued right facial weakness, numbness as well as right upper and lower extremity weakness and numbness.  Patient was seen in the emergency department roughly 2 weeks ago and diagnosed with Bell's palsy.  She states she was given prescription for steroids which she has now finished.  She states that for the past couple of days she has noticed intermittent right upper and lower extremity weakness and some numbness.  She has noticed some difficulty with using her right hand.  This has been accompanied by some right sided headache.  Patient denies any trauma to her head.  Records reviewed. Per medical record review patient has a history of ER visit roughly 2 weeks ago with diagnosis of bells palsy. Did undergo MRI/MRV of the brain during that work up which was consistent with bells palsy.    Past Medical History:  Diagnosis Date  . DVT (deep venous thrombosis) (HCC)   . Kidney infection   . Pulmonary embolism (HCC)   . UTI (lower urinary tract infection)     There are no problems to display for this patient.   History reviewed. No pertinent surgical history.  Prior to Admission medications   Medication Sig Start Date End Date Taking? Authorizing Provider  Aspirin-Salicylamide-Caffeine (BC HEADACHE POWDER PO) Take 1 packet by mouth as needed (headache).     [provider]  dexamethasone (DECADRON) 4 MG tablet Take 1 tablet (4 mg total) by mouth 2 (two) times daily with a meal. 11/25/14   Ivery Quale, PA-C  HYDROcodone-acetaminophen (NORCO/VICODIN) 5-325 MG per tablet Take 2 tablets by mouth every 6 (six) hours as  needed for pain. Patient not taking: Reported on 11/25/2014 04/25/13   Roxy Horseman, PA-C  ibuprofen (ADVIL,MOTRIN) 800 MG tablet Take 1 tablet (800 mg total) by mouth 3 (three) times daily. 11/25/14   Ivery Quale, PA-C  lamoTRIgine (LAMICTAL) 100 MG tablet Take 100 mg by mouth daily.    [provider]  methocarbamol (ROBAXIN) 500 MG tablet Take 1 tablet (500 mg total) by mouth 2 (two) times daily. Patient not taking: Reported on 11/25/2014 04/25/13   Roxy Horseman, PA-C  sulfamethoxazole-trimethoprim (SEPTRA DS) 800-160 MG per tablet Take 1 tablet by mouth every 12 (twelve) hours. Patient not taking: Reported on 11/25/2014 04/25/13   Roxy Horseman, PA-C    Allergies Penicillins  History reviewed. No pertinent family history.  Social History Social History   Tobacco Use  . Smoking status: Current Every Day Smoker    Packs/day: 0.50    Types: Cigarettes  . Smokeless tobacco: Never Used  . Tobacco comment: 8-10/day  Substance Use Topics  . Alcohol use: Yes    Comment: occasionally  . Drug use: Yes    Types: Marijuana    Comment: last [place] this morning    Review of Systems Constitutional: No fever/chills Eyes: Positive for right eye vision change.  ENT: No sore throat. Cardiovascular: Denies chest pain. Respiratory: Denies shortness of breath. Gastrointestinal: No abdominal pain.  No nausea, no vomiting.  No diarrhea.   Genitourinary: Negative for dysuria. Musculoskeletal: Negative for back pain. Skin: Negative for rash. Neurological: Positive for right facial weakness, intermittent right upper and lower  extremity weakness/numbness. ____________________________________________   PHYSICAL EXAM:  VITAL SIGNS: ED Triage Vitals  Enc Vitals Group     BP 10/04/19 1514 123/80     Pulse Rate 10/04/19 1514 85     Resp 10/04/19 1514 18     Temp 10/04/19 1514 97.7 F (36.5 C)     Temp Source 10/04/19 1514 Oral     SpO2 10/04/19 1514 98 %     Weight 10/04/19  1516 230 lb (104.3 kg)     Height 10/04/19 1516 5\' 8"  (1.727 m)     Head Circumference --      Peak Flow --      Pain Score 10/04/19 1516 0   Constitutional: Alert and oriented.  Eyes: Conjunctivae are normal.  ENT      Head: Normocephalic and atraumatic.      Nose: No congestion/rhinnorhea.      Mouth/Throat: Mucous membranes are moist.      Neck: No stridor. Hematological/Lymphatic/Immunilogical: No cervical lymphadenopathy. Cardiovascular: Normal rate, regular rhythm.  No murmurs, rubs, or gallops.  Respiratory: Normal respiratory effort without tachypnea nor retractions. Breath sounds are clear and equal bilaterally. No wheezes/rales/rhonchi. Gastrointestinal: Soft and non tender. No rebound. No guarding.  Genitourinary: Deferred Musculoskeletal: Normal range of motion in all extremities. No lower extremity edema. Neurologic:  Right facial droop, weakness to periorbital muscles. Strength 4/5 in right upper and lower extremity. Subjective numbness to right calf and right hand.  Skin:  Skin is warm, dry and intact. No rash noted. Psychiatric: Mood and affect are normal. Speech and behavior are normal. Patient exhibits appropriate insight and judgment.  ____________________________________________    LABS (pertinent positives/negatives)  CBC wbc 10.2, hgb 14.9, plt 291 CMP wnl except ca 8.8  ____________________________________________   EKG  I, 12/04/19, attending physician, personally viewed and interpreted this EKG  EKG Time: 1520 Rate: 96 Rhythm: normal sinus rhythm Axis: normal Intervals: qtc 472 QRS: narrow ST changes: no st elevation Impression: normal ekg  ____________________________________________    RADIOLOGY  CT head Normal ct  ____________________________________________   PROCEDURES  Procedures  ____________________________________________   INITIAL IMPRESSION / ASSESSMENT AND PLAN / ED COURSE  Pertinent labs & imaging results  that were available during my care of the patient were reviewed by me and considered in my medical decision making (see chart for details).   Patient with recent diagnosis of Bell's palsy by MRI who presents to the emergency department today because of concerns for continued right facial weakness as well as intermittent right upper and lower extremity weakness and numbness.  On exam patient is slightly weak on the right side.  Has clear findings for Bell's palsy.  Patient did obtain a head CT from triage which did not show any acute abnormalities.  I did have a discussion with the patient.  At this point I doubt patient would have a new stroke or stroke like lesion given recent negative MRI.  She does have a fair amount of anxiety about this potential diagnosis given family history.  I did try to reassure her again that the MRI and CT scan today were both without findings consistent with stroke.  Did try giving patient medication for the headache in case she is now developing somewhat complicated migraine type symptoms.  After his medication she stated the headache did somewhat help and she started having some feeling return to her hand.  She already has neurology appointment scheduled in 3 days.  I think is reasonable for patient  be discharged at this time.  Stressed patient portance of following up with neurology.  ____________________________________________   FINAL CLINICAL IMPRESSION(S) / ED DIAGNOSES  Final diagnoses:  Bell's palsy  Paresthesia     Note: This dictation was prepared with Dragon dictation. Any transcriptional errors that result from this process are unintentional     Nance Pear, MD 10/04/19 2054

## 2019-10-04 NOTE — ED Triage Notes (Signed)
Pt presents to ED via POV with c/o R arm numbness and R foot numbness. Pt states she is unable to make a fist with her R hand at this time. Pt states recently dx with bell's palsy.   Pt also c/o SOB at this time.  Pt states symptoms started intermittently several days, with worsening today between 11-12pm. Pt states symptoms never completely resolved after starting several days ago, but have progressively worsened.   Pt noted to have difficulty closing R hand to assess grip strengths. Pt states hx of bell's palsy to R side do face. Pt ambulatory without difficulty, no slurred speech in triage.

## 2019-10-04 NOTE — ED Triage Notes (Signed)
FIRST NURSE NOTE:  Pt arrived via POV with reports of right arm numbness and right foot numbness that started about 2 hours ago, pt recently dx with Bell's Palsy on 2/20.  Pt ambulatory without difficulty. Tearful at registration desk.

## 2019-10-04 NOTE — ED Notes (Signed)
ED Provider at bedside. 

## 2019-10-04 NOTE — ED Notes (Signed)
This note is not being shared with the patient for the following reason: To prevent harm (release of this note would result in harm to the life or physical safety of the patient or another). Pt upset in room with this RN due to only orders from MD being Compazine and fluid. Pt states that this RN is not respectful. This RN apologized to pt about any miscommunication and asked MD to evaluate pt's need for medication. Pt stated to this RN multiple times that she did not want medication and that she would just like to leave. At this time, pt wanting different nurse and charge RN aware. Eileen Stanford RN in to see pt and give medication.

## 2019-10-04 NOTE — ED Notes (Signed)
This RN made 2 unsuccessful attempts at PIV insertion; 1 attempt at right forearm, 1 at right [facility]. MD informed. Oral meds ordered per patient request. Oral meds given.

## 2019-10-04 NOTE — ED Notes (Signed)
This RN spoke with Dr. Marisa Severin regarding patient care, per Dr. Marisa Severin, no code stroke at this time.

## 2019-10-04 NOTE — Discharge Instructions (Addendum)
As we discussed please use artifical tears or eye lubricants to help protect your right eye. Taping it shut at night will also help protect it. Please be sure to follow up with the appointment you have scheduled for Wednesday with neurology.

## 2019-10-04 NOTE — ED Notes (Signed)
Reviewed discharge instructions, follow-up care, and OTC medications with patient. Patient verbalized understanding of all information reviewed. Patient stable, with no distress noted at this time.    

## 2020-01-27 ENCOUNTER — Emergency Department: Payer: Self-pay

## 2020-01-27 ENCOUNTER — Other Ambulatory Visit: Payer: Self-pay

## 2020-01-27 ENCOUNTER — Emergency Department
Admission: EM | Admit: 2020-01-27 | Discharge: 2020-01-27 | Disposition: A | Discharge status: Home / Self Care | Payer: Self-pay | Attending: Emergency Medicine | Admitting: Emergency Medicine

## 2020-01-27 DIAGNOSIS — Z880 Allergy status to penicillin: Secondary | ICD-10-CM | POA: Insufficient documentation

## 2020-01-27 DIAGNOSIS — G43809 Other migraine, not intractable, without status migrainosus: Secondary | ICD-10-CM | POA: Insufficient documentation

## 2020-01-27 DIAGNOSIS — F410 Panic disorder [episodic paroxysmal anxiety] without agoraphobia: Secondary | ICD-10-CM | POA: Insufficient documentation

## 2020-01-27 DIAGNOSIS — F17210 Nicotine dependence, cigarettes, uncomplicated: Secondary | ICD-10-CM | POA: Insufficient documentation

## 2020-01-27 LAB — DIFFERENTIAL
Abs Immature Granulocytes: 0.04 10*3/uL (ref 0.00–0.07)
Basophils Absolute: 0.1 10*3/uL (ref 0.0–0.1)
Basophils Relative: 1 %
Eosinophils Absolute: 0.1 10*3/uL (ref 0.0–0.5)
Eosinophils Relative: 1 %
Immature Granulocytes: 0 %
Lymphocytes Relative: 27 %
Lymphs Abs: 2.6 10*3/uL (ref 0.7–4.0)
Monocytes Absolute: 0.7 10*3/uL (ref 0.1–1.0)
Monocytes Relative: 7 %
Neutro Abs: 6.1 10*3/uL (ref 1.7–7.7)
Neutrophils Relative %: 64 %

## 2020-01-27 LAB — CBC
HCT: 40.7 % (ref 36.0–46.0)
Hemoglobin: 14.2 g/dL (ref 12.0–15.0)
MCH: 28.9 pg (ref 26.0–34.0)
MCHC: 34.9 g/dL (ref 30.0–36.0)
MCV: 82.7 fL (ref 80.0–100.0)
Platelets: 295 10*3/uL (ref 150–400)
RBC: 4.92 MIL/uL (ref 3.87–5.11)
RDW: 12.8 % (ref 11.5–15.5)
WBC: 9.5 10*3/uL (ref 4.0–10.5)
nRBC: 0 % (ref 0.0–0.2)

## 2020-01-27 LAB — COMPREHENSIVE METABOLIC PANEL
ALT: 29 U/L (ref 0–44)
AST: 23 U/L (ref 15–41)
Albumin: 4 g/dL (ref 3.5–5.0)
Alkaline Phosphatase: 72 U/L (ref 38–126)
Anion gap: 9 (ref 5–15)
BUN: 12 mg/dL (ref 6–20)
CO2: 22 mmol/L (ref 22–32)
Calcium: 9.2 mg/dL (ref 8.9–10.3)
Chloride: 109 mmol/L (ref 98–111)
Creatinine, Ser: 0.91 mg/dL (ref 0.44–1.00)
GFR calc Af Amer: >60 mL/min (ref >60)
GFR calc non Af Amer: >60 mL/min (ref >60)
Glucose, Bld: 129 mg/dL — ABNORMAL HIGH (ref 70–99)
Potassium: 3.7 mmol/L (ref 3.5–5.1)
Sodium: 140 mmol/L (ref 135–145)
Total Bilirubin: 0.5 mg/dL (ref 0.3–1.2)
Total Protein: 6.9 g/dL (ref 6.5–8.1)

## 2020-01-27 LAB — PROTIME-INR
INR: 0.9 (ref 0.8–1.2)
Prothrombin Time: 11.9 seconds (ref 11.4–15.2)

## 2020-01-27 LAB — URINE DRUG SCREEN, QUALITATIVE (ARMC ONLY)
Amphetamines, Ur Screen: POSITIVE — AB
Barbiturates, Ur Screen: NOT DETECTED
Benzodiazepine, Ur Scrn: NOT DETECTED
Cannabinoid 50 Ng, Ur ~~LOC~~: POSITIVE — AB
Cocaine Metabolite,Ur ~~LOC~~: NOT DETECTED
MDMA (Ecstasy)Ur Screen: NOT DETECTED
Methadone Scn, Ur: NOT DETECTED
Opiate, Ur Screen: NOT DETECTED
Phencyclidine (PCP) Ur S: NOT DETECTED
Tricyclic, Ur Screen: NOT DETECTED

## 2020-01-27 LAB — PREGNANCY, URINE: Preg Test, Ur: NEGATIVE

## 2020-01-27 LAB — APTT: aPTT: 29 seconds (ref 24–36)

## 2020-01-27 LAB — GLUCOSE, CAPILLARY: Glucose-Capillary: 75 mg/dL (ref 70–99)

## 2020-01-27 MED ORDER — SODIUM CHLORIDE 0.9% FLUSH: 3.0000 mL | Freq: Once | INTRAVENOUS | Status: DC

## 2020-01-27 MED ORDER — LORAZEPAM 2 MG/ML IJ SOLN
1.0000 mg | Freq: Once | INTRAMUSCULAR | Status: AC
  Administered 2020-01-27: 1 mg via INTRAVENOUS
  Filled 2020-01-27: qty 1

## 2020-01-27 NOTE — ED Notes (Signed)
cbg recollected d/t not showing in system. Cbg 75.

## 2020-01-27 NOTE — Consult Note (Signed)
[facility] TeleNeurology Consult Services   Date of Service:   01/27/2020 18:19:23  Impression:     .  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 Here, exam notable for mild right facial weakness (chronic), mild LUE drift, mild LLE drift, and mild left hemisensory disturbance. Head CT showed no acute intracranial pathology. Presentation most consistent with migraine w/ aura. Cannot exclude migrainous stroke although this is extremely rare and in my experience typically occurs in older women. Discussed with the patient the possibility of stroke and risks and benefits of tPA. She feels that alot of her symptoms are related to anxiety and declined treatment. Recommend the following:  - MRI brain w/o contrast  - compazine and benadryl q8 prn for HA  - Neurology to follow  Metrics: Last Known Well: 01/27/2020 15:30:00 [facility] Notification Time: 01/27/2020 18:19:23 Arrival Time: 01/27/2020 17:24:00 Stamp Time: 01/27/2020 18:19:23 Time First Login Attempt: 01/27/2020 18:24:07 Symptoms: headache and left sided numbness NIHSS Start Assessment Time: 01/27/2020 18:24:41 Patient is not a candidate for Alteplase/Activase. Alteplase Medical Decision: 01/27/2020 18:40:00 Patient was not deemed candidate for Alteplase/Activase thrombolytics because of following reasons: Mild symptoms and patient declined.  CT head showed no acute hemorrhage or acute core infarct.  ED Physician notified of diagnostic impression and management plan on 01/27/2020 17:55:00  Advanced Imaging: Advanced Imaging Not Recommended because:  Clinical Presentation is not Suggestive of LVO and NIHSS is <6   Our recommendations are outlined below.  Recommendations:     .  Activate Stroke Protocol Admission/Order Set     .  Stroke/Telemetry Floor     .  Neuro Checks     .  Bedside Swallow  Eval     .  DVT Prophylaxis     .  IV Fluids, Normal Saline     .  Head of Bed 30 Degrees     .  Euglycemia and Avoid Hyperthermia (PRN Acetaminophen)  Routine Consultation with Inhouse Neurology for Follow up Care  Sign Out:     .  Discussed with Emergency Department Provider    ------------------------------------------------------------------------------  History of Present Illness: Patient is a 34 year old Female.  Patient was brought by private transportation with symptoms of headache and left sided numbness  Diane Kim w/ a hx of Bell's Palsy and TIA who presents with headache and left sided numbness. LKN at 1530. Patient developed severe HA as well as hazy yellow pulsating distortion in left eye, left face, arm, leg paresthesia, and some left arm weakness. Also has nausea and mild photophobia. Of note, patient smokes. Has hx of TIA. Here, exam notable for mild right facial weakness (chronic), mild LUE drift, mild LLE drift, and mild left hemisensory disturbance. Head CT showed no acute intracranial pathology.  Last seen normal was within 4.5 hours. There is no history of hemorrhagic complications or intracranial hemorrhage. There is no history of Recent Anticoagulants. There is no history of recent major surgery.  Anticoagulant use:  No  Antiplatelet use: No    Examination: BP(136/108), Pulse(89), Blood Glucose(129) 1A: Level of Consciousness - Alert; keenly responsive + 0 1B: Ask Month and Age - Both Questions Right + 0 1C: Blink Eyes & Squeeze Hands - Performs Both Tasks + 0 2: Test Horizontal Extraocular Movements - Normal + 0 3: Test Visual Fields - No Visual Loss + 0 4: Test Facial Palsy (Use Grimace if  Obtunded) - Minor paralysis (flat nasolabial fold, smile asymmetry) + 1 5A: Test Left Arm Motor Drift - Drift, but doesn't hit bed + 1 5B: Test Right Arm Motor Drift - No Drift for 10 Seconds + 0 6A: Test Left Leg Motor Drift - Drift, but doesn't hit bed +  1 6B: Test Right Leg Motor Drift - No Drift for 5 Seconds + 0 7: Test Limb Ataxia (FNF/Heel-Shin) - Ataxia in 1 Limb + 1 8: Test Sensation - Mild-Moderate Loss: Less Sharp/More Dull + 1 9: Test Language/Aphasia - Normal; No aphasia + 0 10: Test Dysarthria - Normal + 0 11: Test Extinction/Inattention - No abnormality + 0  NIHSS Score: 5  Head CT: No acute intracranial pathology  Pre-Morbid Modified Ranking Scale: 0 Points = No symptoms at all   Patient/Family was informed the Neurology Consult would occur via TeleHealth consult by way of interactive audio and video telecommunications and consented to receiving care in this manner.   Patient is being evaluated for possible acute neurologic impairment and high probability of imminent or life-threatening deterioration. I spent total of 30 minutes providing care to this patient, including time for face to face visit via telemedicine, review of medical records, imaging studies and discussion of findings with providers, the patient and/or family.   Dr Vicente Masson   [facility] [phone]  Case [id number]

## 2020-01-27 NOTE — ED Triage Notes (Addendum)
Pt felt like heart racing and left arm numbness and tingling 2 hours ago at 1530.  Has had 2 TIA in past.  No tingling in right arm.  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

## 2020-01-27 NOTE — ED Notes (Signed)
ED Provider at bedside. 

## 2020-01-27 NOTE — ED Provider Notes (Signed)
[facility] Emergency Department Provider Note  ____________________________________________   First MD Initiated Contact with Patient 01/27/20 1743     (approximate)  I have reviewed the triage vital signs and the nursing notes.   HISTORY  Chief Complaint left arm numbness   HPI Diane Kim is a 34 y.o. female with below list of previous medical conditions including DVT pulmonary emboli prescribed Xarelto but unable to afford secondary to lack of insurance since February, TIA presents to the emergency department due to acute  onset of palpitation and left arm numbness and tingling that began at 3:30 PM today.  Patient states that she was on the phone with her boyfriend when she heard him fall she quickly arrived at his residence and found him unresponsive at which point her symptoms began.  Patient denies any headache at present no weakness.  Patient denies any nausea or vomiting.  Patient states that symptoms may be secondary to "panic attack".  Which she has had in the past however she was concerned because of her history of previous TIAs.        Past Medical History:  Diagnosis Date  . DVT (deep venous thrombosis) (HCC)   . Kidney infection   . Pulmonary embolism (HCC)   . UTI (lower urinary tract infection)     There are no problems to display for this patient.   No past surgical history on file.  Prior to Admission medications   Medication Sig Start Date End Date Taking? Authorizing Provider  ibuprofen (ADVIL) 200 MG tablet Take 400-800 mg by mouth every 6 (six) hours as needed for fever or mild pain.   Yes [provider]    Allergies Penicillins  No family history on file.  Social History Social History   Tobacco Use  . Smoking status: Current Every Day Smoker    Packs/day: 0.50    Types: Cigarettes  . Smokeless tobacco: Never Used  . Tobacco comment: 8-10/day  Vaping Use  . Vaping Use: Never used  Substance Use  Topics  . Alcohol use: Yes    Comment: occasionally  . Drug use: Yes    Types: Marijuana    Comment: last [place] this morning    Review of Systems Constitutional: No fever/chills Eyes: No visual changes. ENT: No sore throat. Cardiovascular: Denies chest pain. Respiratory: Denies shortness of breath. Gastrointestinal: No abdominal pain.  No nausea, no vomiting.  No diarrhea.  No constipation. Genitourinary: Negative for dysuria. Musculoskeletal: Negative for neck pain.  Negative for back pain. Integumentary: Negative for rash. Neurological: Negative for headaches.  Positive for left arm numbness  ____________________________________________   PHYSICAL EXAM:  VITAL SIGNS: ED Triage Vitals  Enc Vitals Group     BP 01/27/20 1739 136/82     Pulse Rate 01/27/20 1739 (!) 108     Resp 01/27/20 1739 16     Temp 01/27/20 1800 98.1 F (36.7 C)     Temp Source 01/27/20 1800 Oral     SpO2 01/27/20 1739 98 %     Weight 01/27/20 1750 101.5 kg (223 lb 12.3 oz)     Height 01/27/20 1750 1.727 m (5\' 8" )     Head Circumference --      Peak Flow --      Pain Score 01/27/20 1750 6     Pain Loc --      Pain Edu? --      Excl. in GC? --     Constitutional: Alert and  oriented.  Eyes: Conjunctivae are normal.  Head: Atraumatic. Mouth/Throat: Patient is wearing a mask. Neck: No stridor.  No meningeal signs.   Cardiovascular: Normal rate, regular rhythm. Good peripheral circulation. Grossly normal heart sounds. Respiratory: Normal respiratory effort.  No retractions. Gastrointestinal: Soft and nontender. No distention.   Musculoskeletal: No lower extremity tenderness nor edema. No gross deformities of extremities. Neurologic:  Normal speech and language. No gross focal neurologic deficits are appreciated.  Skin:  Skin is warm, dry and intact. Psychiatric: Mood and affect are normal. Speech and behavior are normal.  ____________________________________________   LABS (all labs ordered are  listed, but only abnormal results are displayed)  Labs Reviewed  COMPREHENSIVE METABOLIC PANEL - Abnormal; Notable for the following components:      Result Value   Glucose, Bld 129 (*)    All other components within normal limits  URINE DRUG SCREEN, QUALITATIVE ([facility] ONLY) - Abnormal; Notable for the following components:   Amphetamines, Ur Screen POSITIVE (*)    Cannabinoid 50 Ng, Ur  POSITIVE (*)    All other components within normal limits  PROTIME-INR  APTT  CBC  DIFFERENTIAL  GLUCOSE, CAPILLARY  PREGNANCY, URINE  CBG MONITORING, ED  I-STAT CREATININE, ED  POC URINE PREG, ED   ____________________________________________  EKG  ED ECG REPORT I, [place] N Reice Bienvenue, the attending physician, personally viewed and interpreted this ECG.   Date: 01/27/2020  EKG Time: 5:29 PM  Rate: 100  Rhythm: Normal sinus rhythm  Axis: Normal  Intervals: Normal  ST&T Change: None  ____________________________________________  RADIOLOGY I,  N Evangela Heffler, personally viewed and evaluated these images (plain radiographs) as part of my medical decision making, as well as reviewing the written report by the radiologist.  ED MD interpretation: Normal CT head per radiologist.  Official radiology report(s): MR BRAIN WO CONTRAST  Result Date: 01/27/2020 CLINICAL DATA:  Initial evaluation for acute neuro deficit, left arm numbness and tingling. EXAM: MRI HEAD WITHOUT CONTRAST TECHNIQUE: Multiplanar, multiecho pulse sequences of the brain and surrounding structures were obtained without intravenous contrast. COMPARISON:  Prior CT from earlier the same day. FINDINGS: Brain: Cerebral volume within normal limits for patient age. No focal parenchymal signal abnormality identified. No abnormal foci of restricted diffusion to suggest acute or subacute ischemia. Gray-white matter differentiation well maintained. No encephalomalacia to suggest chronic infarction. No foci of susceptibility artifact to  suggest acute or chronic intracranial hemorrhage. No mass lesion, midline shift or mass effect. No hydrocephalus. No extra-axial fluid collection. Major dural sinuses are grossly patent. Pituitary gland and suprasellar region are normal. Midline structures intact and normal. Vascular: Major intracranial vascular flow voids well maintained and normal in appearance. Skull and upper cervical spine: Craniocervical junction normal. Visualized upper cervical spine within normal limits. Bone marrow signal intensity normal. No scalp soft tissue abnormality. Sinuses/Orbits: Globes and orbital soft tissues within normal limits. Right frontoethmoidal sinusitis noted. Paranasal sinuses are otherwise largely clear. No significant mastoid effusion. Inner ear structures grossly normal. Other: None. IMPRESSION: 1. Stable normal brain MRI. No acute intracranial infarct or other abnormality identified. 2. Right frontoethmoidal sinusitis, likely allergic/inflammatory nature. Electronically Signed   By: Rise Mu M.D.   On: 01/27/2020 21:19   CT HEAD CODE STROKE WO CONTRAST  Result Date: 01/27/2020 CLINICAL DATA:  Code stroke. Acute neuro deficit. Left arm numbness and tingling. EXAM: CT HEAD WITHOUT CONTRAST TECHNIQUE: Contiguous axial images were obtained from the base of the skull through the vertex without intravenous contrast. COMPARISON:  CT  head 10/04/2019 FINDINGS: Brain: No evidence of acute infarction, hemorrhage, hydrocephalus, extra-axial collection or mass lesion/mass effect. Vascular: Negative for hyperdense vessel Skull: No focal skeletal lesion. Sinuses/Orbits: Mucosal edema right frontal sinus.  Negative orbit. Other: None ASPECTS (Alberta Stroke Program Early CT Score) - Ganglionic level infarction (caudate, lentiform nuclei, internal capsule, insula, M1-M3 cortex): 7 - Supraganglionic infarction (M4-M6 cortex): 3 Total score (0-10 with 10 being normal): 10 IMPRESSION: 1. Normal CT of the brain. 2.  ASPECTS is 10 3. Interval development of mucosal thickening right frontal sinus. 4. These results were called by telephone at the time of interpretation on 01/27/2020 at 5:46 pm to provider [facility] , who verbally acknowledged these results. Electronically Signed   By: Marlan Palau M.D.   On: 01/27/2020 17:46    _______________________________________ Procedures   ____________________________________________   INITIAL IMPRESSION / MDM / ASSESSMENT AND PLAN / ED COURSE  As part of my medical decision making, I reviewed the following data within the electronic MEDICAL RECORD NUMBER  34 year old female presented with above-stated history and physical exam a differential diagnosis including but not limited to panic attack,migraine with aura, CVA TIA.  CT head reported as normal per radiologist.  Patient evaluated by telemetry neurology who stated that he suspected this to be a migraine with aura however recommended an MRI of the brain.  MRI of the brain revealed no acute intracranial abnormality.  Patient will be discharged home with outpatient follow-up  ____________________________________________  FINAL CLINICAL IMPRESSION(S) / ED DIAGNOSES  Final diagnoses:  Other migraine without status migrainosus, not intractable  Panic attack     MEDICATIONS GIVEN DURING THIS VISIT:  Medications  sodium chloride flush (NS) 0.9 % injection 3 mL ( Intravenous Canceled Entry 01/27/20 1800)  LORazepam (ATIVAN) injection 1 mg (1 mg Intravenous Given 01/27/20 2016)     ED Discharge Orders    None      *Please note:  Diane Kim was evaluated in Emergency Department on 01/27/2020 for the symptoms described in the history of present illness. She was evaluated in the context of the global COVID-19 pandemic, which necessitated consideration that the patient might be at risk for infection with the SARS-CoV-2 virus that causes COVID-19. Institutional protocols and algorithms that pertain to the evaluation  of patients at risk for COVID-19 are in a state of rapid change based on information released by regulatory bodies including the CDC and federal and state organizations. These policies and algorithms were followed during the patient's care in the ED.  Some ED evaluations and interventions may be delayed as a result of limited staffing during and after the pandemic.*  Note:  This document was prepared using Dragon voice recognition software and may include unintentional dictation errors.   Darci Current, MD 01/27/20 [phone]

## 2020-01-27 NOTE — Progress Notes (Addendum)
   01/27/20 1739  Clinical Encounter Type  Visited With Patient  Visit Type Initial  Referral From Nurse  Consult/Referral To Chaplain  Chaplain responded to page for Code Stroke. When chaplain arrived patient was on a bed in the hallway. Patient explained that she had a stressful day and wasn't sure what was going on with her. 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 Patient told chaplain that about 8 years ago she traveled to [place], [place] and other [place] countries. She said that she learned to interpret the Arabic language. After the doctor made possible diagnosis chaplain asked if she could pray for her and patient said yes. Chaplain prayed and wished patient well.

## 2020-01-27 NOTE — ED Triage Notes (Addendum)
Pt reports left arm numbness and tingling, pt reports she last knows that she felt well around 1530 but that it could have been as early as 1300. Hx of TIA. Pt takes xarelto but last dose was February.

## 2020-01-27 NOTE — ED Notes (Signed)
Neurologist on screen 

## 2021-06-26 IMAGING — MR MR HEAD W/O CM
10 series · 43 of 48 positions shown · non-contrast
Comparison: CT head earlier today.

CLINICAL DATA: 33-year-old female with right facial numbness onset
this morning, apparent right facial weakness.

EXAM:
MRI HEAD WITHOUT CONTRAST
TECHNIQUE: Multiplanar, multiecho pulse sequences of the brain and surrounding
structures were obtained without intravenous contrast.

[Series 2: ax dwi_tracew · axial · 3.0mm · 0.71mm/px · z∈[-104,+60]mm · 6 of 56 slices shown]
[im 1/56]
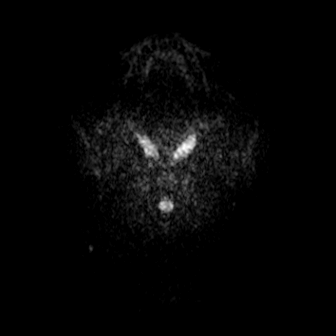
[im 12/56]
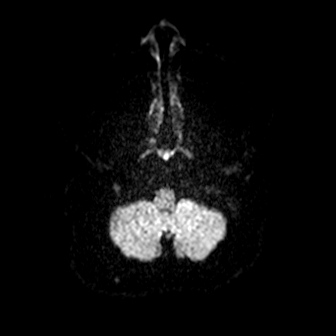
[im 23/56]
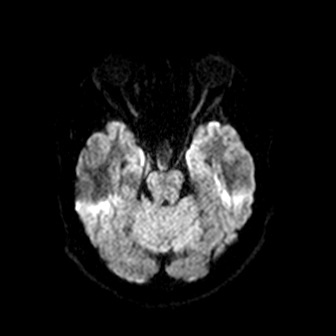
[im 34/56]
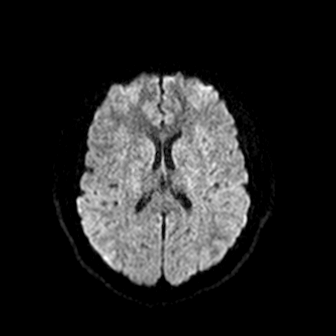
[im 45/56]
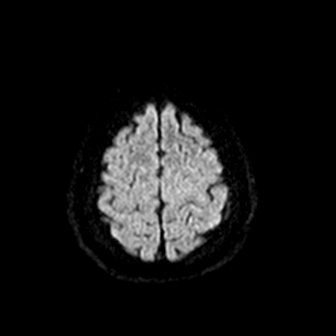
[im 56/56]
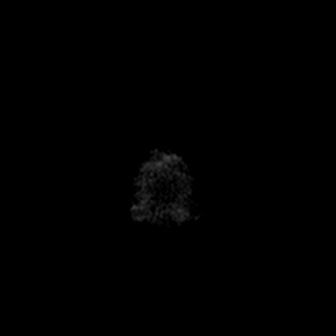

[Series 3: ax dwi_adc · axial · 3.0mm · 0.71mm/px · z∈[-104,+60]mm · 6 of 56 slices shown]
[im 1/56]
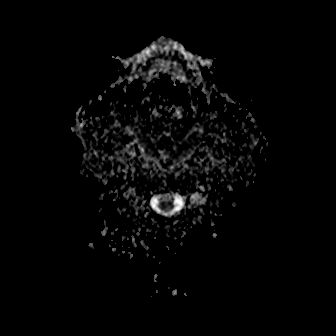
[im 12/56]
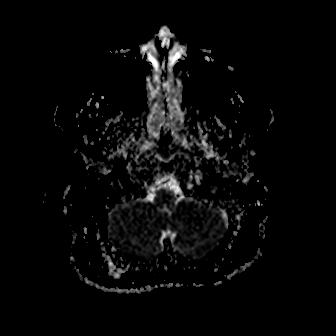
[im 23/56]
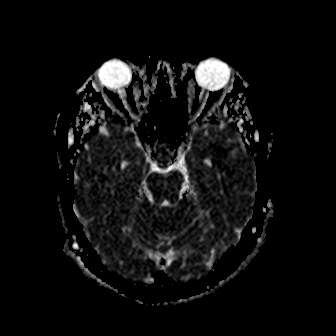
[im 34/56]
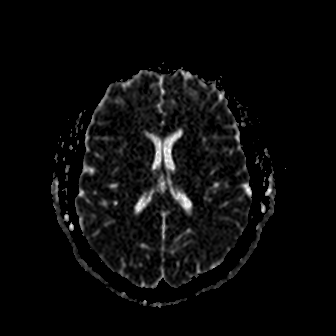
[im 45/56]
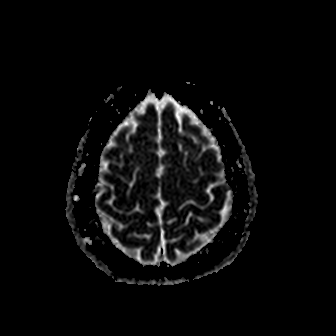
[im 56/56]
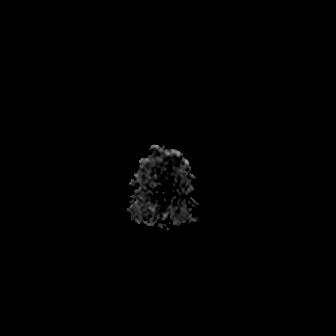

[Series 4: cor dwi_tracew · coronal · 5.0mm · 0.68mm/px · 3 of 36 slices shown]
[im 1/36]
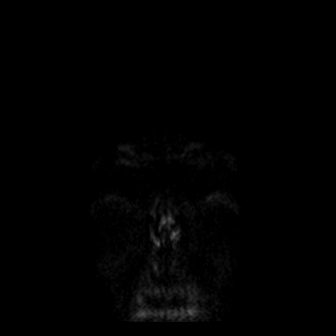
[im 18/36]
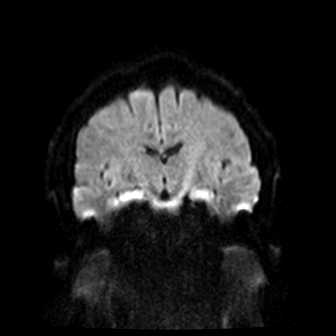
[im 36/36]
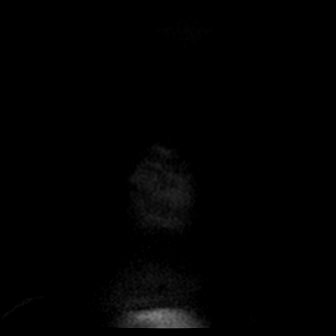

[Series 5: cor dwi_adc · coronal · 5.0mm · 0.68mm/px · 3 of 36 slices shown]
[im 1/36]
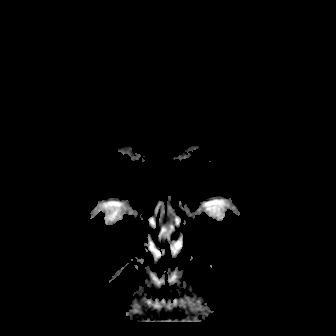
[im 18/36]
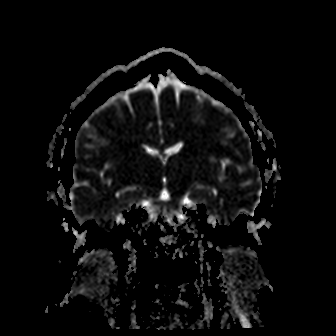
[im 36/36]
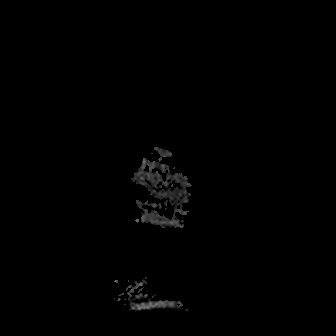

[Series 6: FLAIR · axial · 3.0mm · 0.69mm/px · z∈[-99,+62]mm · 5 of 55 slices shown]
[im 1/55]
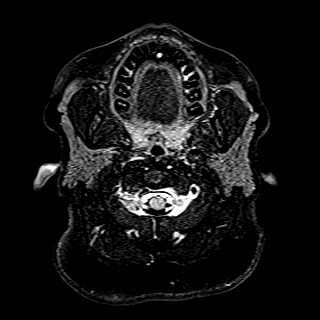
[im 14/55]
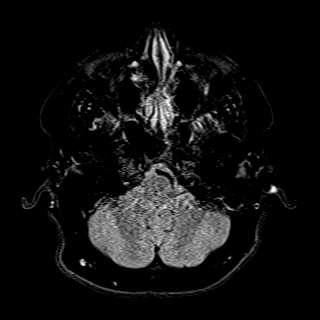
[im 28/55]
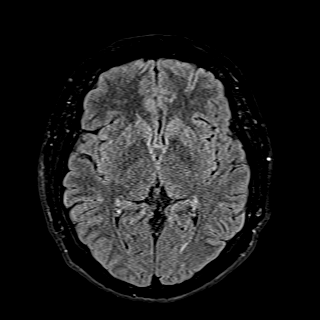
[im 41/55]
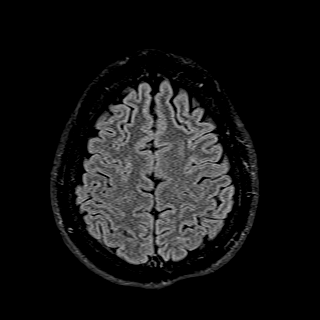
[im 55/55]
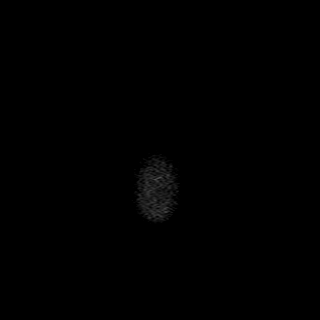

[Series 7: T2 · axial · 5.0mm · 0.45mm/px · z∈[-96,+59]mm · 2 of 27 slices shown (1 of 2)]
[im 1/27]
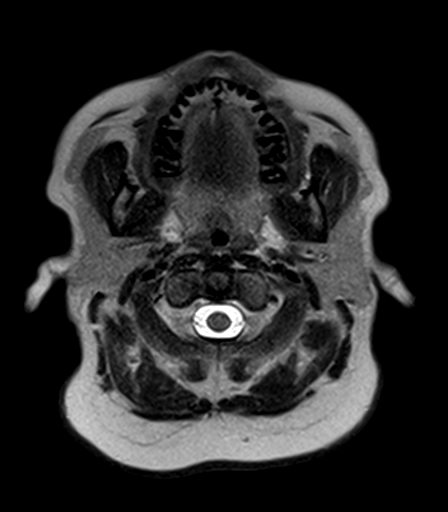
[im 27/27]
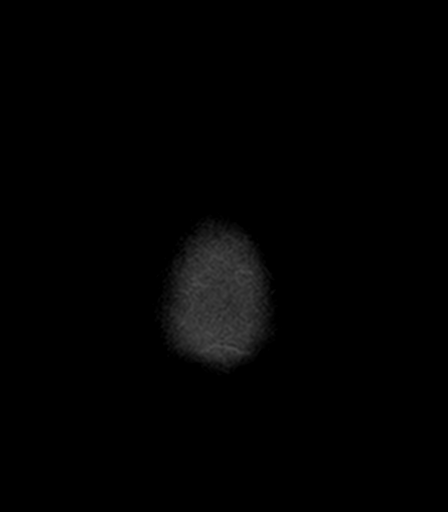

[Series 8: T2-star · axial · 5.0mm · 0.45mm/px · z∈[-96,+59]mm · 2 of 27 slices shown]
[im 1/27]
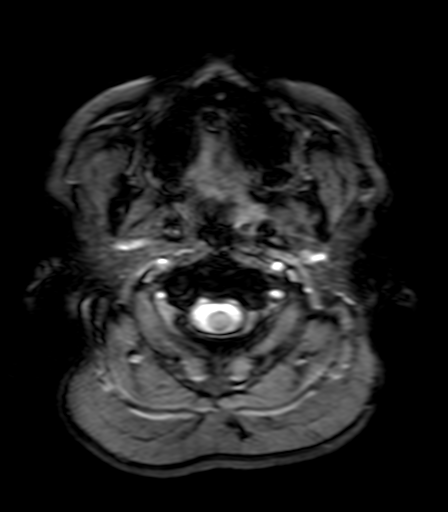
[im 27/27]
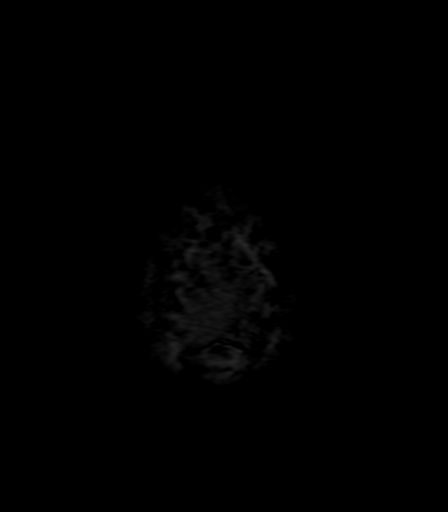

[Series 9: T1 · sagittal · 5.0mm · 0.94mm/px · 2 of 25 slices shown (1 of 2)]
[im 1/25]
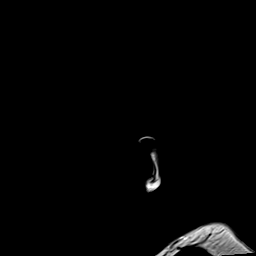
[im 25/25]
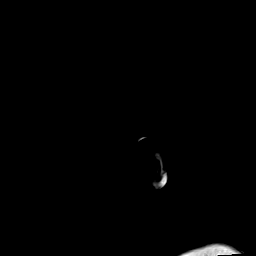

[Series 10: T1 · axial · 1.0mm · 0.98mm/px · z∈[-103,+72]mm · 11 of 174 slices shown (2 of 2)]
[im 1/174]
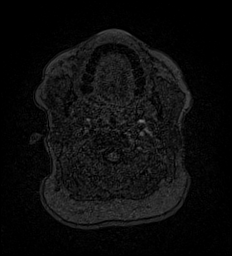
[im 12/174]
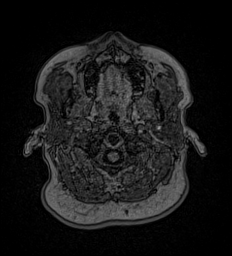
[im 24/174]
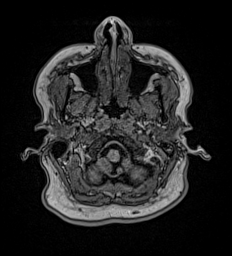
[im 35/174]
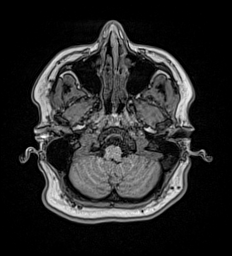
[im 47/174]
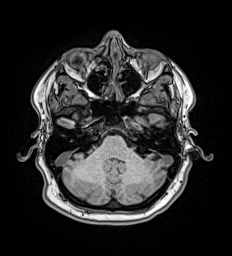
[im 58/174]
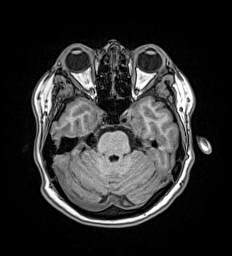
[im 81/174]
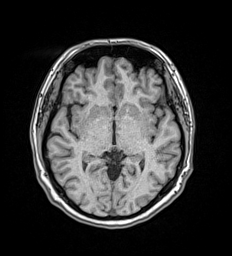
[im 104/174]
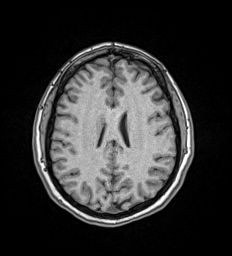
[im 127/174]
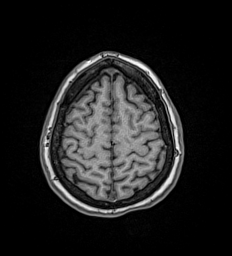
[im 150/174]
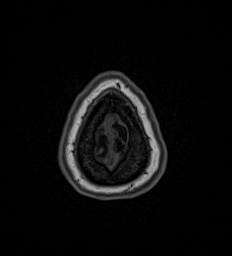
[im 174/174]
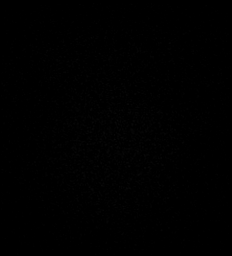

[Series 11: T2 · coronal · 5.0mm · 0.45mm/px · 3 of 31 slices shown (2 of 2)]
[im 1/31]
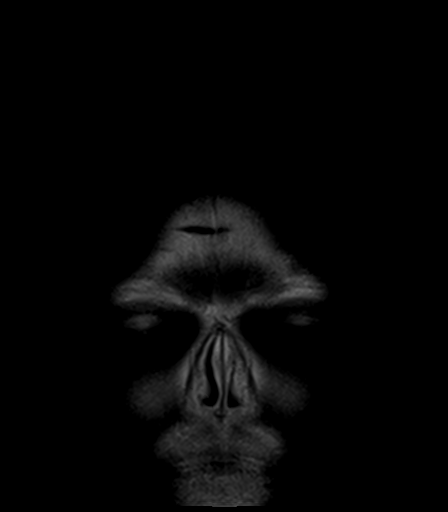
[im 16/31]
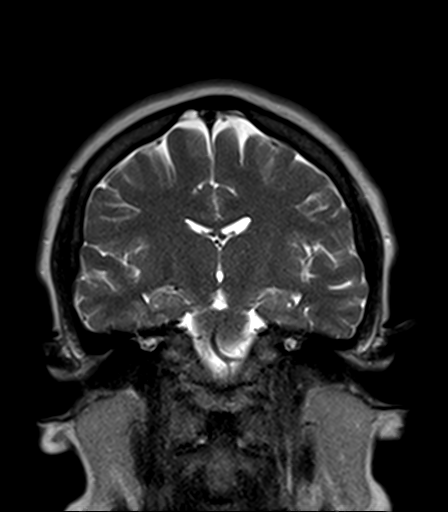
[im 31/31]
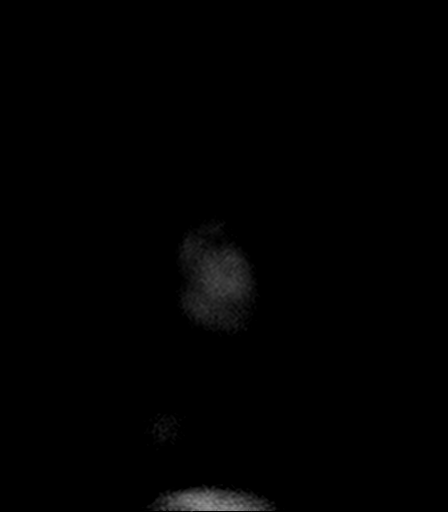

[43 of 48 positions shown; findings below may reference images not displayed]

FINDINGS: Brain: No restricted diffusion to suggest acute infarction. No
midline shift, mass effect, evidence of mass lesion,
ventriculomegaly, extra-axial collection or acute intracranial
hemorrhage. Cervicomedullary junction and pituitary are within
normal limits.

Normal cerebral volume. Gray and white matter signal is within
normal limits throughout the brain. No encephalomalacia or chronic
cerebral blood products identified.

Vascular: Major intracranial vascular flow voids are preserved.

Skull and upper cervical spine: Negative visible cervical spine.
Visualized bone marrow signal is within normal limits.

Sinuses/Orbits: Symmetric and negative orbits. Paranasal Visualized
paranasal sinuses and mastoids are stable and well pneumatized.

Other: See also intracranial MRV today reported separately.
IMPRESSION: 1. Negative for acute infarct and normal noncontrast MRI appearance
of the brain.
2. See also intracranial MRV with contrast today reported
separately.
